# Patient Record
Sex: Female | Born: 2000 | Race: Black or African American | Hispanic: No | Marital: Single
Health system: Southern US, Community
[De-identification: ages and names within clinical notes are randomized; demographics above are authoritative.]

---
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
## 2019-12-17 DIAGNOSIS — Z20828 Contact with and (suspected) exposure to other viral communicable diseases: Secondary | ICD-10-CM | POA: Diagnosis not present

## 2019-12-17 DIAGNOSIS — Z03818 Encounter for observation for suspected exposure to other biological agents ruled out: Secondary | ICD-10-CM | POA: Diagnosis not present

## 2021-09-05 DIAGNOSIS — Z01419 Encounter for gynecological examination (general) (routine) without abnormal findings: Secondary | ICD-10-CM | POA: Diagnosis not present

## 2021-09-05 DIAGNOSIS — Z113 Encounter for screening for infections with a predominantly sexual mode of transmission: Secondary | ICD-10-CM | POA: Diagnosis not present

## 2021-09-19 DIAGNOSIS — Z23 Encounter for immunization: Secondary | ICD-10-CM | POA: Diagnosis not present

## 2021-09-19 DIAGNOSIS — M25522 Pain in left elbow: Secondary | ICD-10-CM | POA: Diagnosis not present

## 2021-09-21 DIAGNOSIS — M25511 Pain in right shoulder: Secondary | ICD-10-CM | POA: Diagnosis not present

## 2021-09-21 DIAGNOSIS — M25522 Pain in left elbow: Secondary | ICD-10-CM | POA: Diagnosis not present

## 2021-09-21 DIAGNOSIS — M25512 Pain in left shoulder: Secondary | ICD-10-CM | POA: Diagnosis not present

## 2021-09-21 DIAGNOSIS — Y998 Other external cause status: Secondary | ICD-10-CM | POA: Diagnosis not present

## 2021-09-21 DIAGNOSIS — M79622 Pain in left upper arm: Secondary | ICD-10-CM | POA: Diagnosis not present

## 2022-09-05 DIAGNOSIS — E559 Vitamin D deficiency, unspecified: Secondary | ICD-10-CM | POA: Diagnosis not present

## 2022-09-05 DIAGNOSIS — Z13228 Encounter for screening for other metabolic disorders: Secondary | ICD-10-CM | POA: Diagnosis not present

## 2022-09-05 DIAGNOSIS — L200 Besnier's prurigo: Secondary | ICD-10-CM | POA: Diagnosis not present

## 2022-09-05 DIAGNOSIS — R5383 Other fatigue: Secondary | ICD-10-CM | POA: Diagnosis not present

## 2022-09-05 DIAGNOSIS — E785 Hyperlipidemia, unspecified: Secondary | ICD-10-CM | POA: Diagnosis not present

## 2022-09-19 DIAGNOSIS — D509 Iron deficiency anemia, unspecified: Secondary | ICD-10-CM | POA: Diagnosis not present

## 2022-09-19 DIAGNOSIS — E559 Vitamin D deficiency, unspecified: Secondary | ICD-10-CM | POA: Diagnosis not present

## 2022-11-22 DIAGNOSIS — E559 Vitamin D deficiency, unspecified: Secondary | ICD-10-CM | POA: Diagnosis not present

## 2022-11-22 DIAGNOSIS — L209 Atopic dermatitis, unspecified: Secondary | ICD-10-CM | POA: Diagnosis not present

## 2022-11-22 DIAGNOSIS — D509 Iron deficiency anemia, unspecified: Secondary | ICD-10-CM | POA: Diagnosis not present

## 2023-11-06 ENCOUNTER — Ambulatory Visit (HOSPITAL_COMMUNITY)
Admission: EM | Admit: 2023-11-06 | Discharge: 2023-11-06 | Disposition: A | Discharge status: Home / Self Care | Payer: Self-pay | Attending: Emergency Medicine | Admitting: Emergency Medicine

## 2023-11-06 ENCOUNTER — Encounter (HOSPITAL_COMMUNITY): Payer: Self-pay

## 2023-11-06 DIAGNOSIS — H01001 Unspecified blepharitis right upper eyelid: Secondary | ICD-10-CM | POA: Diagnosis not present

## 2023-11-06 MED ORDER — AMOXICILLIN-POT CLAVULANATE 875-125 MG PO TABS: 1.0000 | ORAL_TABLET | Freq: Two times a day (BID) | ORAL | 0 refills | Status: AC

## 2023-11-06 MED ORDER — ERYTHROMYCIN 5 MG/GM OP OINT: TOPICAL_OINTMENT | OPHTHALMIC | 0 refills | Status: AC

## 2023-11-06 NOTE — ED Triage Notes (Signed)
 Patient states she had a small amount of swelling to the right eye yesterday and when she woke this AM she had "significant swelling." Patient denies any pain, vision changes, or drainage from the right eye.

## 2023-11-06 NOTE — Discharge Instructions (Addendum)
 Take the oral antibiotics twice daily for the next 7 days with food to prevent gastrointestinal upset.  Use the erythromycin ointment 4 times daily for the next 5 days as well.  You can do cool compresses to help with swelling.  800 mg of ibuprofen every 8 hours can help with any pain or inflammation.  Return to clinic for any new or urgent symptoms.

## 2023-11-06 NOTE — ED Provider Notes (Signed)
 [facility]    CSN: [id number] Arrival date & time: 11/06/23  1127      History   Chief Complaint Chief Complaint  Patient presents with   Facial Swelling    HPI Kristine Melendez is a 23 y.o. female.   Patient presents to clinic over concern of right upper eyelid swelling.  She wore some cluster lashes over the weekend for graduation photos shoot and decided to remove them by pulling them off on Monday.  2 days ago she noticed some right upper eyelid swelling that got much worse today.  She has had some watery drainage.  Reports some discomfort and pain to the upper eyelid.  She is not having any vision changes.  Denies any redness in the conjunctiva.   The history is provided by the patient and medical records.    History reviewed. No pertinent past medical history.  There are no active problems to display for this patient.   History reviewed. No pertinent surgical history.  OB History   No obstetric history on file.      Home Medications    Prior to Admission medications   Medication Sig Start Date End Date Taking? Authorizing Provider  amoxicillin-clavulanate (AUGMENTIN) 875-125 MG tablet Take 1 tablet by mouth every 12 (twelve) hours. 11/06/23  Yes Rinaldo Ratel, [place] N, FNP  erythromycin ophthalmic ointment Place a 1/2 inch ribbon of ointment into the lower eyelid 4x daily for 5 days. 11/06/23  Yes Rinaldo Ratel, [place] N, FNP    Family History Family History  Problem Relation Age of Onset   Hypertension Mother     Social History Social History   Tobacco Use   Smoking status: Never   Smokeless tobacco: Never  Vaping Use   Vaping status: Never Used  Substance Use Topics   Alcohol use: Yes   Drug use: Never     Allergies   Acetaminophen   Review of Systems Review of Systems  Per HPI  Physical Exam Triage Vital Signs ED Triage Vitals [11/06/23 1145]  Encounter Vitals Group     BP 105/63     Systolic BP Percentile      Diastolic  BP Percentile      Pulse Rate 98     Resp 14     Temp 98.6 F (37 C)     Temp Source Oral     SpO2 98 %     Weight      Height      Head Circumference      Peak Flow      Pain Score 0     Pain Loc      Pain Education      Exclude from Growth Chart    No data found.  Updated Vital Signs BP 105/63 (BP Location: Right Arm)   Pulse 98   Temp 98.6 F (37 C) (Oral)   Resp 14   LMP 10/25/2023   SpO2 98%   Visual Acuity Right Eye Distance:   Left Eye Distance:   Bilateral Distance:    Right Eye Near:   Left Eye Near:    Bilateral Near:     Physical Exam Vitals and nursing note reviewed.  Constitutional:      Appearance: Normal appearance.  HENT:     Head: Normocephalic and atraumatic.     Right Ear: External ear normal.     Left Ear: External ear normal.     Nose: Nose normal.     Mouth/Throat:  Mouth: Mucous membranes are moist.  Eyes:     General:        Right eye: No discharge.     Extraocular Movements: Extraocular movements intact.     Conjunctiva/sclera:     Right eye: Right conjunctiva is not injected.     Pupils: Pupils are equal, round, and reactive to light.   Cardiovascular:     Rate and Rhythm: Normal rate.  Pulmonary:     Effort: Pulmonary effort is normal. No respiratory distress.  Skin:    General: Skin is warm and dry.  Neurological:     General: No focal deficit present.     Mental Status: She is alert.  Psychiatric:        Mood and Affect: Mood normal.        Behavior: Behavior is cooperative.      UC Treatments / Results  Labs (all labs ordered are listed, but only abnormal results are displayed) Labs Reviewed - No data to display  EKG   Radiology No results found.  Procedures Procedures (including critical care time)  Medications Ordered in UC Medications - No data to display  Initial Impression / Assessment and Plan / UC Course  I have reviewed the triage vital signs and the nursing notes.  Pertinent labs &  imaging results that were available during my care of the patient were reviewed by me and considered in my medical decision making (see chart for details).  Vitals and triage reviewed, patient is hemodynamically stable.  Right upper eyelid with significant swelling, warmth and erythema.  PERRLA.  Denies vision changes. Conjunctiva within normal limits.  Concern over preseptal cellulitis versus blepharitis from traumatic eyelash removal.  Will cover with Augmentin and erythromycin ointment.  Symptomatic management discussed.  Plan of care, follow-up care return precautions given, no questions at this time.    Final Clinical Impressions(s) / UC Diagnoses   Final diagnoses:  Blepharitis of right upper eyelid, unspecified type     Discharge Instructions      Take the oral antibiotics twice daily for the next 7 days with food to prevent gastrointestinal upset.  Use the erythromycin ointment 4 times daily for the next 5 days as well.  You can do cool compresses to help with swelling.  800 mg of ibuprofen every 8 hours can help with any pain or inflammation.  Return to clinic for any new or urgent symptoms.     ED Prescriptions     Medication Sig Dispense Auth. Provider   erythromycin ophthalmic ointment Place a 1/2 inch ribbon of ointment into the lower eyelid 4x daily for 5 days. 3.5 g Hollyn Stucky, [place] N, FNP   amoxicillin-clavulanate (AUGMENTIN) 875-125 MG tablet Take 1 tablet by mouth every 12 (twelve) hours. 14 tablet Amberlea Spagnuolo, [place] N, [place]      PDMP not reviewed this encounter.   Taylee Gunnells, [place] N, [place] 11/06/23 1228
